# Patient Record
Sex: Female | Born: 2000 | Race: White | Hispanic: No | Marital: Single | Smoking: Never smoker
Health system: Southern US, Community
[De-identification: ages and names within clinical notes are randomized; demographics above are authoritative.]

## PROBLEM LIST (undated history)

## (undated) DIAGNOSIS — Z9889 Other specified postprocedural states: Secondary | ICD-10-CM

## (undated) HISTORY — PX: UPPER GI ENDOSCOPY: SHX6162

## (undated) HISTORY — PX: TONSILLECTOMY: SUR1361

---
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
## 2018-02-13 ENCOUNTER — Emergency Department (INDEPENDENT_AMBULATORY_CARE_PROVIDER_SITE_OTHER)
Admission: EM | Admit: 2018-02-13 | Discharge: 2018-02-13 | Disposition: A | Discharge status: Home / Self Care | Payer: Self-pay | Source: Home / Self Care | Attending: Family Medicine | Admitting: Family Medicine

## 2018-02-13 ENCOUNTER — Emergency Department (INDEPENDENT_AMBULATORY_CARE_PROVIDER_SITE_OTHER): Payer: Self-pay

## 2018-02-13 ENCOUNTER — Other Ambulatory Visit: Payer: Self-pay

## 2018-02-13 DIAGNOSIS — M62838 Other muscle spasm: Secondary | ICD-10-CM

## 2018-02-13 DIAGNOSIS — M5412 Radiculopathy, cervical region: Secondary | ICD-10-CM

## 2018-02-13 DIAGNOSIS — M542 Cervicalgia: Secondary | ICD-10-CM

## 2018-02-13 DIAGNOSIS — R202 Paresthesia of skin: Secondary | ICD-10-CM

## 2018-02-13 DIAGNOSIS — D170 Benign lipomatous neoplasm of skin and subcutaneous tissue of head, face and neck: Secondary | ICD-10-CM

## 2018-02-13 HISTORY — DX: Other specified postprocedural states: Z98.890

## 2018-02-13 LAB — POCT URINE PREGNANCY: Preg Test, Ur: NEGATIVE

## 2018-02-13 LAB — POCT URINALYSIS DIP (MANUAL ENTRY)
BILIRUBIN UA: NEGATIVE
Blood, UA: NEGATIVE
Glucose, UA: NEGATIVE mg/dL
Ketones, POC UA: NEGATIVE mg/dL
Nitrite, UA: NEGATIVE
Protein Ur, POC: NEGATIVE mg/dL
Spec Grav, UA: 1.015 (ref 1.010–1.025)
Urobilinogen, UA: 1 E.U./dL
pH, UA: 7 (ref 5.0–8.0)

## 2018-02-13 MED ORDER — PREDNISONE 20 MG PO TABS: ORAL_TABLET | ORAL | 0 refills | Status: DC

## 2018-02-13 NOTE — Discharge Instructions (Addendum)
Apply ice pack to back of neck for 20 to 30 minutes, 3 to 4 times daily  Continue until pain and swelling decrease.  Begin neck range of motion and stretching exercises as tolerated (stop exercises if your arm symptoms become worse). Resume taking baclofen  Adjust your chair at work so that your neck remains in a more comfortable position.

## 2018-02-13 NOTE — ED Triage Notes (Signed)
Pt noticed a knot on the back of head about 1 month ago.  Denies injury, Stated now having nerve pain, pain in the neck and back, Shooting pain in the right leg, N/V/D, poor appetite, and hot flashes

## 2018-02-14 NOTE — ED Provider Notes (Signed)
[facility]    CSN: [id number] Arrival date & time: 02/13/18  1557     History   Chief Complaint Chief Complaint  Patient presents with  . Knot on back of head    HPI Gwendolyn Vang is a 18 y.o. female.   Patient has a variety of complaints, including most significantly the development of a painful "knot" on her left occipital scalp about a month ago.  The nodule was initially soft, becoming firmer and less painful.  She denies scalp injury.  She also reports subsequent development of pain in her posterior neck with recurring paresthesias in her arms that are exacerbated by neck movement.  She denies history of neck injury. She states that she works at a [employer] during the day. Review of previous records reveals that patient had a normal CT scan of head 06/13/17 ([facility])  The history is provided by the patient.    Past Medical History:  Diagnosis Date  . Hx of colonoscopy     Active problems:  Anxiety and depression   Past Surgical History:  Procedure Laterality Date  . TONSILLECTOMY    . UPPER GI ENDOSCOPY         Home Medications    Prior to Admission medications   Medication Sig Start Date End Date Taking? Authorizing Provider  amitriptyline (ELAVIL) 10 MG tablet Take 1-2 tabs (10-20mg ) PO QHS 12/25/17  Yes [provider]  baclofen (LIORESAL) 10 MG tablet Take 1-2 tabs (10-20mg ) PO every 6-8 hours PRN up to TID for pain. 06/20/17  Yes [provider]  busPIRone (BUSPAR) 7.5 MG tablet Take by mouth. 02/05/18  Yes [provider]  hydrOXYzine (ATARAX/VISTARIL) 25 MG tablet TAKE 1 TAB AT BEDTIME & 1/2 TAB IN AM IF NEEDED UNTIL ANXIETY CALMS DOWN & FOR SLEEP/ANXIETY 02/12/18  Yes [provider]  norethindrone-ethinyl estradiol (NORTREL 0.5/35, 28,) 0.5-35 MG-MCG tablet TAKE 1 TABLET BY MOUTH EVERY DAY 12/18/17  Yes [provider]  predniSONE (DELTASONE) 20 MG tablet Take one tab by mouth twice daily for 4  days, then one daily for 3 days. Take with food. 02/13/18   Kandra Nicolas, MD    Family History Patient records no significant family history  Social History Social History   Tobacco Use  . Smoking status: Not on file  Substance Use Topics  . Alcohol use: Not on file  . Drug use: Not on file     Allergies   Doxycycline   Review of Systems Review of Systems  Constitutional: Positive for appetite change. Negative for activity change, chills, diaphoresis, fatigue and fever.  HENT:       Nodule left occipital area  Eyes: Negative.   Respiratory: Negative.   Cardiovascular: Negative.   Gastrointestinal: Negative.   Endocrine: Negative.   Genitourinary: Negative.   Musculoskeletal: Positive for neck pain and neck stiffness. Negative for gait problem.  Skin: Negative.   Neurological: Positive for headaches.       Intermittent paresthesias arms     Physical Exam Triage Vital Signs ED Triage Vitals  Enc Vitals Group     BP 02/13/18 1617 116/84     Pulse Rate 02/13/18 1617 95     Resp 02/13/18 1617 18     Temp 02/13/18 1617 98.2 F (36.8 C)     Temp Source 02/13/18 1617 Oral     SpO2 02/13/18 1617 97 %     Weight 02/13/18 1618 181 lb (82.1 kg)  Height 02/13/18 1618 5' 6.5" (1.689 m)     Head Circumference --      Peak Flow --      Pain Score 02/13/18 1618 6     Pain Loc --      Pain Edu? --      Excl. in [place]? --    No data found.  Updated Vital Signs BP 116/84 (BP Location: Right Arm)   Pulse 95   Temp 98.2 F (36.8 C) (Oral)   Resp 18   Ht 5' 6.5" (1.689 m)   Wt 82.1 kg   LMP 02/05/2018   SpO2 97%   BMI 28.78 kg/m   Visual Acuity Right Eye Distance:   Left Eye Distance:   Bilateral Distance:    Right Eye Near:   Left Eye Near:    Bilateral Near:     Physical Exam Vitals signs and nursing note reviewed.  Constitutional:      General: She is not in acute distress.    Appearance: Normal appearance. She is not ill-appearing or diaphoretic.   HENT:     Head: Normocephalic.      Comments: Left occipital scalp has a 1.5cm diameter subcutaneous mobile soft nodule, mildly tender to palpation, most consistent with a lipoma.     Right Ear: Tympanic membrane, ear canal and external ear normal.     Left Ear: Tympanic membrane, ear canal and external ear normal.     Nose: Nose normal.     Mouth/Throat:     Pharynx: Oropharynx is clear.  Eyes:     Extraocular Movements: Extraocular movements intact.     Conjunctiva/sclera: Conjunctivae normal.     Pupils: Pupils are equal, round, and reactive to light.  Neck:     Musculoskeletal: Neck supple. Muscular tenderness present.      Comments: Posterior neck and trapezius muscles are tender to palpation bilaterally. Cardiovascular:     Heart sounds: Normal heart sounds.  Pulmonary:     Breath sounds: Normal breath sounds.  Musculoskeletal:     Right lower leg: No edema.     Left lower leg: No edema.  Lymphadenopathy:     Cervical: No cervical adenopathy.  Skin:    General: Skin is warm and dry.  Neurological:     General: No focal deficit present.     Mental Status: She is alert.      UC Treatments / Results  Labs (all labs ordered are listed, but only abnormal results are displayed) Labs Reviewed  POCT URINALYSIS DIP (MANUAL ENTRY) - Abnormal; Notable for the following components:      Result Value   Leukocytes, UA Small (1+) (*)    All other components within normal limits  POCT URINE PREGNANCY negative    EKG None  Radiology Dg Cervical Spine Complete  Result Date: 02/13/2018 CLINICAL DATA:  Neck pain and upper extremity paresthesias with neck movement. Left arm numbness. No known injury. EXAM: CERVICAL SPINE - COMPLETE 4+ VIEW COMPARISON:  None. FINDINGS: Straightening of the normal cervical lordosis. Otherwise, normal appearing bones and soft tissues. IMPRESSION: Straightening of the normal cervical lordosis. This can be seen with muscle spasm. Otherwise, normal  examination. Electronically Signed   By: Claudie Revering M.D.   On: 02/13/2018 17:36    Procedures Procedures (including critical care time)  Medications Ordered in UC Medications - No data to display  Initial Impression / Assessment and Plan / UC Course  I have reviewed the triage vital signs and  the nursing notes.  Pertinent labs & imaging results that were available during my care of the patient were reviewed by me and considered in my medical decision making (see chart for details).    Patient appears to have a benign lipoma on her posterior scalp, not contributing to her neck soreness and arm paresthesias.  Her neck muscle spasm is most likely a result of uncomfortable neck position while working at her computer. Followup with Dr. Aundria Mems or Dr. Lynne Leader ([facility]) if not improving about two weeks.   Final Clinical Impressions(s) / UC Diagnoses   Final diagnoses:  Neck muscle spasm  Cervical radiculopathy  Lipoma of scalp     Discharge Instructions     Apply ice pack to back of neck for 20 to 30 minutes, 3 to 4 times daily  Continue until pain and swelling decrease.  Begin neck range of motion and stretching exercises as tolerated (stop exercises if your arm symptoms become worse). Resume taking baclofen  Adjust your chair at work so that your neck remains in a more comfortable position.   ED Prescriptions    Medication Sig Dispense Auth. Provider   predniSONE (DELTASONE) 20 MG tablet Take one tab by mouth twice daily for 4 days, then one daily for 3 days. Take with food. 11 tablet Kandra Nicolas, MD         Kandra Nicolas, MD 02/14/18 [place]

## 2018-08-20 ENCOUNTER — Encounter: Payer: Self-pay | Admitting: Emergency Medicine

## 2018-08-20 ENCOUNTER — Other Ambulatory Visit: Payer: Self-pay

## 2018-08-20 ENCOUNTER — Emergency Department
Admission: EM | Admit: 2018-08-20 | Discharge: 2018-08-20 | Disposition: A | Discharge status: Home / Self Care | Payer: Self-pay | Source: Home / Self Care | Attending: Family Medicine | Admitting: Family Medicine

## 2018-08-20 DIAGNOSIS — R519 Headache, unspecified: Secondary | ICD-10-CM

## 2018-08-20 DIAGNOSIS — R51 Headache: Secondary | ICD-10-CM

## 2018-08-20 DIAGNOSIS — J029 Acute pharyngitis, unspecified: Secondary | ICD-10-CM

## 2018-08-20 MED ORDER — IPRATROPIUM BROMIDE 0.06 % NA SOLN: 2.0000 | Freq: Four times a day (QID) | NASAL | 1 refills | Status: AC

## 2018-08-20 MED ORDER — KETOROLAC TROMETHAMINE 60 MG/2ML IM SOLN
60.0000 mg | Freq: Once | INTRAMUSCULAR | Status: AC
  Administered 2018-08-20: 60 mg via INTRAMUSCULAR

## 2018-08-20 MED ORDER — CETIRIZINE HCL 10 MG PO TABS: 10.0000 mg | ORAL_TABLET | Freq: Every day | ORAL | 0 refills | Status: AC

## 2018-08-20 NOTE — Discharge Instructions (Signed)
°  Your headache and sore throat could be due to environmental allergies such as grass or pollen given recent high humidity and hot temperatures.   Your symptoms may also be the early signs of a viral infection.   You may take 500mg  acetaminophen every 4-6 hours or in combination with ibuprofen (Motrin, Advil) 400-600mg  every 6-8 hours as needed for pain, inflammation, and fever.  You may also try cough drops/throat lozenges or chloraseptic spray and salt water gargles to help with your sore throat.   Be sure to drink at least eight 8oz glasses of water to stay well hydrated and get at least 8 hours of sleep at night, preferably more while sick.   Please call to schedule a follow up appointment with family medicine in 1 week if not improving, sooner if symptoms worsening- fever, vomiting, trouble swallowing or breathing.

## 2018-08-20 NOTE — ED Provider Notes (Signed)
[facility]    CSN: [id number] Arrival date & time: 08/20/18  0915     History   Chief Complaint Chief Complaint  Patient presents with  . Sinus Problem    HPI Gwendolyn Vang is a 18 y.o. female.   HPI  Gwendolyn Vang is a 18 y.o. female presenting to UC with c/o sudden onset sinus pain and pressure that radiates to her teeth, associated sore throat. Symptoms started yesterday. She was outside yesterday and reports being stung by a bee but does not have known allergies to bees. Denies rash. Denies difficulty breathing or swallowing. She has taken Tylenol w/o relief.  Denies congestion, cough, fever, chills, n/v/d. No known sick contacts. No hx of sinus infections in the past. She does have a hx of migraines but only ever takes Tylenol for her migraines, which typically helps.  Denies neck pain, body aches or rash. 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 Pt states she "knows" she does not have Covid.    Past Medical History:  Diagnosis Date  . Hx of colonoscopy     There are no active problems to display for this patient.   Past Surgical History:  Procedure Laterality Date  . TONSILLECTOMY    . UPPER GI ENDOSCOPY      OB History   No obstetric history on file.      Home Medications    Prior to Admission medications   Medication Sig Start Date End Date Taking? Authorizing Provider  busPIRone (BUSPAR) 7.5 MG tablet Take by mouth. 02/05/18   [provider]  cetirizine (ZYRTEC) 10 MG tablet Take 1 tablet (10 mg total) by mouth daily. 08/20/18   Noe Gens, PA-C  ipratropium (ATROVENT) 0.06 % nasal spray Place 2 sprays into both nostrils 4 (four) times daily. 08/20/18   Noe Gens, PA-C  norethindrone-ethinyl estradiol (NORTREL 0.5/35, 28,) 0.5-35 MG-MCG tablet TAKE 1 TABLET BY MOUTH EVERY DAY 12/18/17   [provider]    Family History History reviewed. No pertinent family history.  Social  History Social History   Tobacco Use  . Smoking status: Never Smoker  . Smokeless tobacco: Never Used  Substance Use Topics  . Alcohol use: Not Currently  . Drug use: Not Currently     Allergies   Doxycycline   Review of Systems Review of Systems  Constitutional: Negative for chills and fever.  HENT: Positive for sinus pressure, sinus pain and sore throat. Negative for congestion, ear pain, postnasal drip, trouble swallowing and voice change.   Respiratory: Negative for cough and shortness of breath.   Cardiovascular: Negative for chest pain and palpitations.  Gastrointestinal: Negative for abdominal pain, diarrhea, nausea and vomiting.  Musculoskeletal: Negative for arthralgias, back pain and myalgias.  Skin: Negative for rash.  Neurological: Positive for headaches (facial). Negative for dizziness and light-headedness.     Physical Exam Triage Vital Signs ED Triage Vitals  Enc Vitals Group     BP      Pulse      Resp      Temp      Temp src      SpO2      Weight      Height      Head Circumference      Peak Flow      Pain Score      Pain Loc      Pain Edu?  Excl. in [place]?    No data found.  Updated Vital Signs BP 106/70 (BP Location: Right Arm)   Pulse (!) 104   Temp 98.3 F (36.8 C) (Oral)   Ht 5\' 8"  (1.727 m)   Wt 175 lb (79.4 kg)   SpO2 100%   BMI 26.61 kg/m   Visual Acuity Right Eye Distance:   Left Eye Distance:   Bilateral Distance:    Right Eye Near:   Left Eye Near:    Bilateral Near:     Physical Exam Vitals signs and nursing note reviewed.  Constitutional:      Appearance: Normal appearance. She is well-developed.  HENT:     Head: Normocephalic and atraumatic.     Right Ear: Tympanic membrane normal.     Left Ear: Tympanic membrane normal.     Nose:     Right Sinus: Maxillary sinus tenderness present. No frontal sinus tenderness.     Left Sinus: Maxillary sinus tenderness present. No frontal sinus tenderness.      Mouth/Throat:     Lips: Pink.     Mouth: Mucous membranes are moist.     Pharynx: Oropharynx is clear. Uvula midline. No pharyngeal swelling, oropharyngeal exudate, posterior oropharyngeal erythema or uvula swelling.     Tonsils: No tonsillar exudate or tonsillar abscesses.  Neck:     Musculoskeletal: Normal range of motion.  Cardiovascular:     Rate and Rhythm: Normal rate and regular rhythm.  Pulmonary:     Effort: Pulmonary effort is normal. No respiratory distress.     Breath sounds: Normal breath sounds. No stridor. No wheezing or rhonchi.  Musculoskeletal: Normal range of motion.  Skin:    General: Skin is warm and dry.     Capillary Refill: Capillary refill takes less than 2 seconds.     Findings: No rash.  Neurological:     Mental Status: She is alert and oriented to person, place, and time.  Psychiatric:        Mood and Affect: Mood normal.        Behavior: Behavior normal.      UC Treatments / Results  Labs (all labs ordered are listed, but only abnormal results are displayed) Labs Reviewed - No data to display  EKG   Radiology No results found.  Procedures Procedures (including critical care time)  Medications Ordered in UC Medications  ketorolac (TORADOL) injection 60 mg (60 mg Intramuscular Given 08/20/18 0952)    Initial Impression / Assessment and Plan / UC Course  I have reviewed the triage vital signs and the nursing notes.  Pertinent labs & imaging results that were available during my care of the patient were reviewed by me and considered in my medical decision making (see chart for details).     Pt c/o scratchy sore throat and facial pain that started last night.  Symptoms started after she was stung by a bee but denies rash or SOB. No known allergies to bees or other insects. Denies fever, chills, cough, congestion. Pt insists she does not have Covid, does not want to be tested.  Will tx for sinus headache. No antibiotics indicated at this  time. Home care info provided.  Final Clinical Impressions(s) / UC Diagnoses   Final diagnoses:  Sinus headache  Sore throat     Discharge Instructions      Your headache and sore throat could be due to environmental allergies such as grass or pollen given recent high humidity and hot temperatures.  Your symptoms may also be the early signs of a viral infection.   You may take 500mg  acetaminophen every 4-6 hours or in combination with ibuprofen (Motrin, Advil) 400-600mg  every 6-8 hours as needed for pain, inflammation, and fever.  You may also try cough drops/throat lozenges or chloraseptic spray and salt water gargles to help with your sore throat.   Be sure to drink at least eight 8oz glasses of water to stay well hydrated and get at least 8 hours of sleep at night, preferably more while sick.   Please call to schedule a follow up appointment with family medicine in 1 week if not improving, sooner if symptoms worsening- fever, vomiting, trouble swallowing or breathing.     ED Prescriptions    Medication Sig Dispense Auth. Provider   ipratropium (ATROVENT) 0.06 % nasal spray Place 2 sprays into both nostrils 4 (four) times daily. 15 mL Gerarda Fraction, Braelyn Jenson O, PA-C   cetirizine (ZYRTEC) 10 MG tablet Take 1 tablet (10 mg total) by mouth daily. 30 tablet Noe Gens, PA-C     Controlled Substance Prescriptions [place] Controlled Substance Registry consulted? Not Applicable   Noe Gens, PA-C 08/20/18 1019

## 2018-08-20 NOTE — ED Triage Notes (Signed)
Sinus pain, pressure, teeth and face hurt started yesterday

## 2019-08-26 IMAGING — DX DG CERVICAL SPINE COMPLETE 4+V
7 series · 7 of 7 positions shown · non-contrast
Comparison: None.

CLINICAL DATA: Neck pain and upper extremity paresthesias with neck
movement. Left arm numbness. No known injury.

EXAM:
CERVICAL SPINE - COMPLETE 4+ VIEW

[c-spine lat]
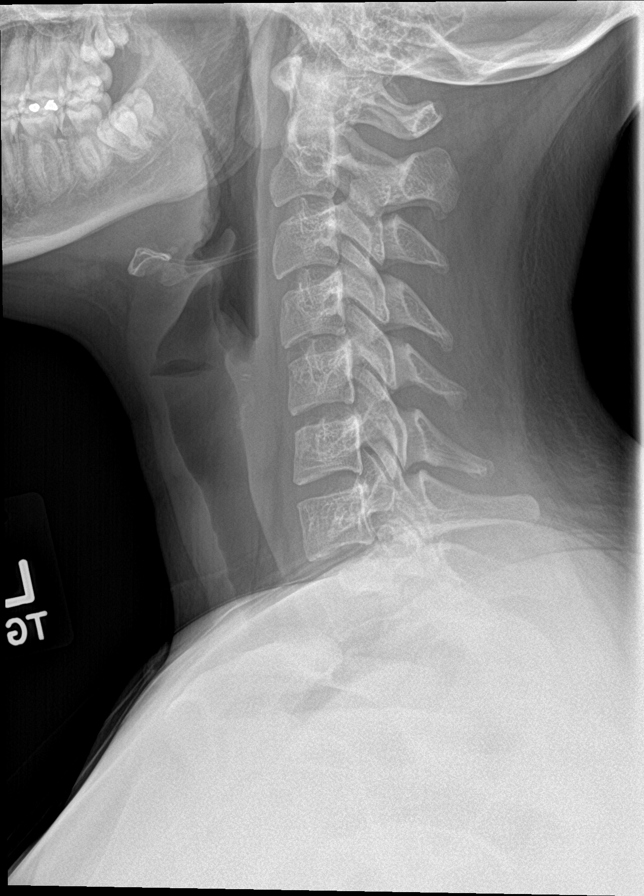

[c-spine obl (1 of 2)]
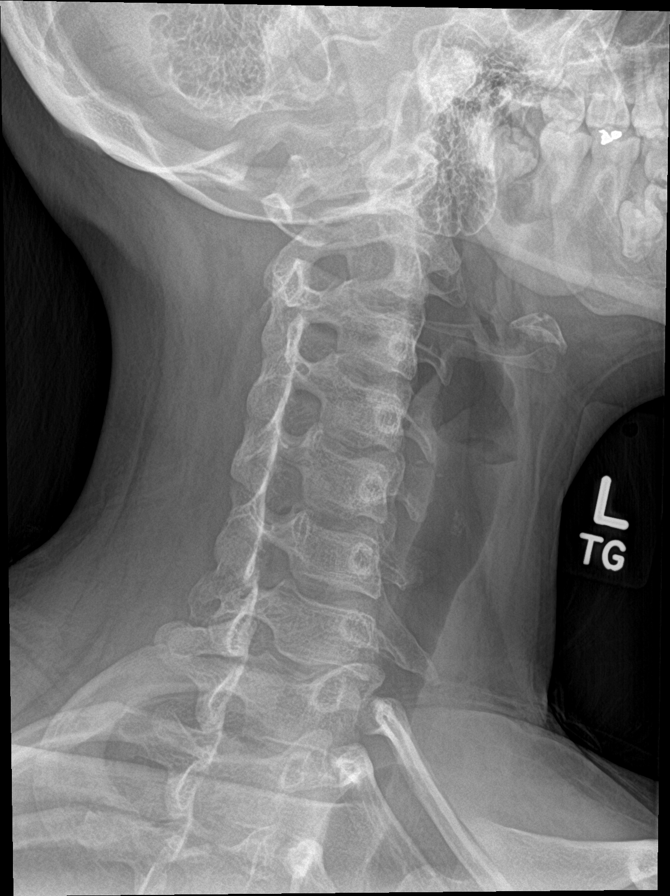

[c-spine swimmers]
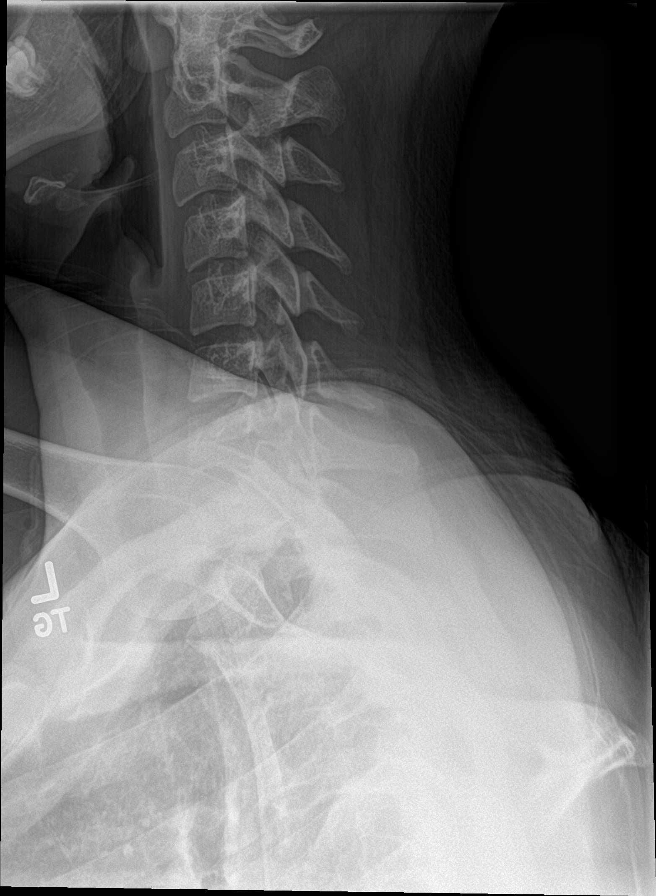

[c-spine obl (2 of 2)]
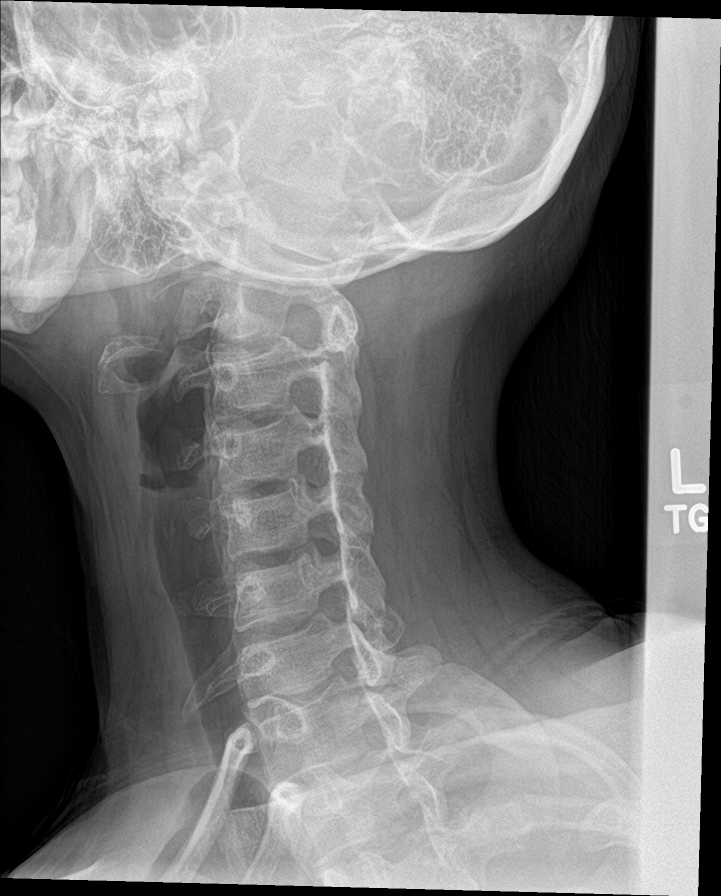

[c-spine ap]
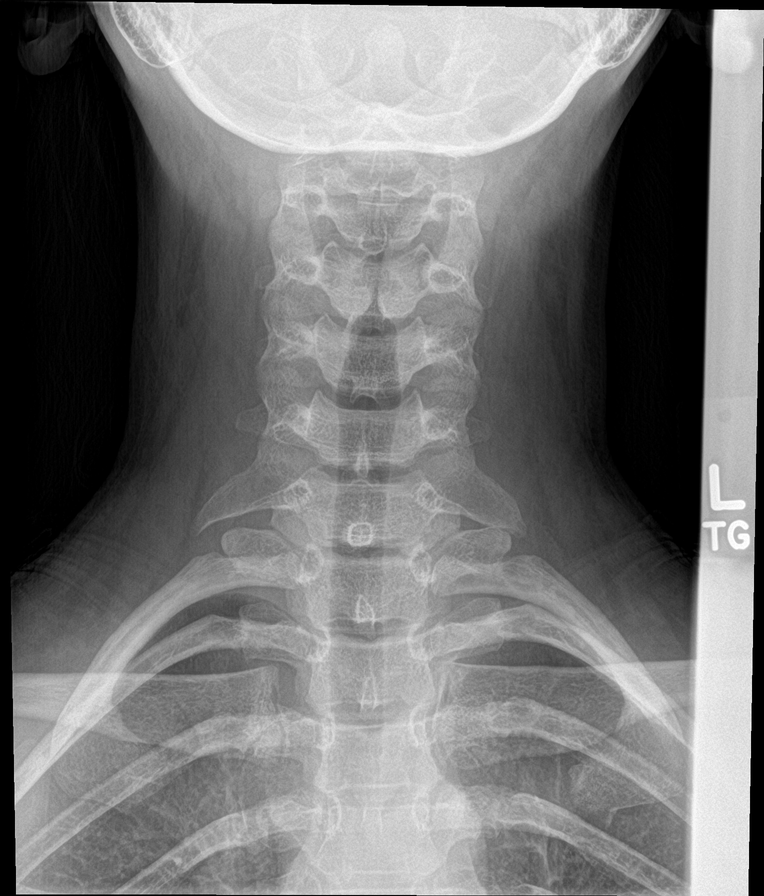

[c-spine open mouth (1 of 2)]
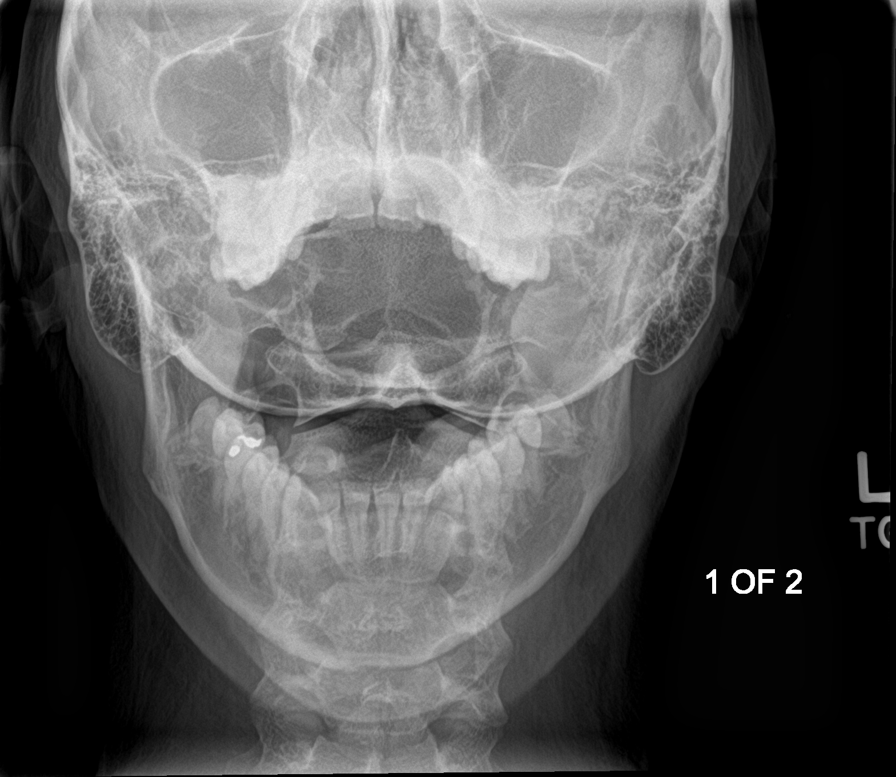

[c-spine open mouth (2 of 2)]
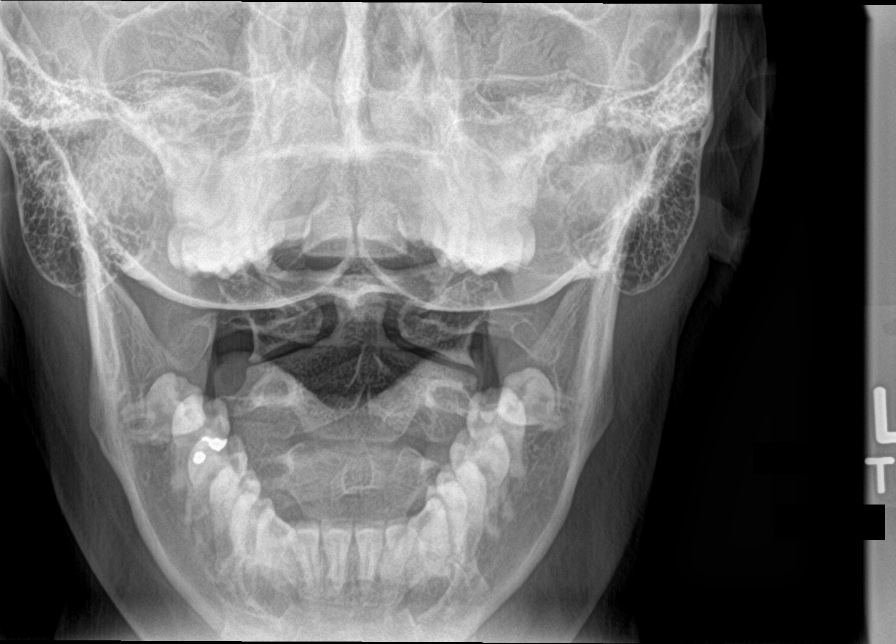

[7 of 7 positions shown; findings below may reference images not displayed]

FINDINGS: Straightening of the normal cervical lordosis. Otherwise, normal
appearing bones and soft tissues.
IMPRESSION: Straightening of the normal cervical lordosis. This can be seen with
muscle spasm. Otherwise, normal examination.
